# Patient Record
Sex: Male | Born: 1985 | Race: White | Hispanic: No | Marital: Married | Smoking: Never smoker
Health system: Southern US, Community
[De-identification: ages and names within clinical notes are randomized; demographics above are authoritative.]

---

## 2008-07-12 ENCOUNTER — Ambulatory Visit: Payer: Self-pay | Admitting: Family Medicine

## 2009-05-21 ENCOUNTER — Emergency Department: Payer: Self-pay | Admitting: Emergency Medicine

## 2017-05-28 ENCOUNTER — Encounter: Payer: Self-pay | Admitting: Physician Assistant

## 2017-05-28 ENCOUNTER — Ambulatory Visit: Payer: Self-pay | Admitting: Physician Assistant

## 2017-05-28 VITALS — BP 115/70 | HR 94 | Temp 98.5°F | Ht 68.0 in | Wt 183.0 lb

## 2017-05-28 DIAGNOSIS — Z008 Encounter for other general examination: Secondary | ICD-10-CM

## 2017-05-28 DIAGNOSIS — Z0000 Encounter for general adult medical examination without abnormal findings: Secondary | ICD-10-CM

## 2017-05-28 DIAGNOSIS — Z0189 Encounter for other specified special examinations: Secondary | ICD-10-CM

## 2017-05-28 NOTE — Progress Notes (Signed)
S: pt here for wellness physical and biometrics for insurance purposes, no complaints ros neg states his cholesterol is always high but its genetic. PMH:    Neg Social: occasional etoh, no drugs, nonsmoker  Fam: mother has RA, father has high cholesterol, sister has substance abuse problems, brother is healthy  O: vitals wnl, nad, ENT wnl, neck supple no lymph, lungs c t a, cv rrr, abd soft nontender bs normal all 4 quads  A: wellness, biometric physical  P:  Labs, f/u prn

## 2017-05-29 LAB — HEPATITIS C ANTIBODY (REFLEX): HCV Ab: 0.2 s/co ratio (ref 0.0–0.9)

## 2017-05-29 LAB — CMP12+LP+TP+TSH+6AC+CBC/D/PLT
A/G RATIO: 2 (ref 1.2–2.2)
ALK PHOS: 61 IU/L (ref 39–117)
ALT: 44 IU/L (ref 0–44)
AST: 25 IU/L (ref 0–40)
Albumin: 4.9 g/dL (ref 3.5–5.5)
BUN/Creatinine Ratio: 15 (ref 9–20)
BUN: 18 mg/dL (ref 6–20)
Basophils Absolute: 0 10*3/uL (ref 0.0–0.2)
Basos: 1 %
Bilirubin Total: 0.6 mg/dL (ref 0.0–1.2)
CALCIUM: 9.6 mg/dL (ref 8.7–10.2)
Chloride: 104 mmol/L (ref 96–106)
Chol/HDL Ratio: 5.3 ratio — ABNORMAL HIGH (ref 0.0–5.0)
Cholesterol, Total: 252 mg/dL — ABNORMAL HIGH (ref 100–199)
Creatinine, Ser: 1.19 mg/dL (ref 0.76–1.27)
EOS (ABSOLUTE): 0.1 10*3/uL (ref 0.0–0.4)
Eos: 2 %
Estimated CHD Risk: 1.1 times avg. — ABNORMAL HIGH (ref 0.0–1.0)
FREE THYROXINE INDEX: 1.7 (ref 1.2–4.9)
GFR calc Af Amer: 94 mL/min/{1.73_m2} (ref >59)
GFR calc non Af Amer: 81 mL/min/{1.73_m2} (ref >59)
GGT: 23 IU/L (ref 0–65)
GLOBULIN, TOTAL: 2.5 g/dL (ref 1.5–4.5)
Glucose: 99 mg/dL (ref 65–99)
HDL: 48 mg/dL (ref >39)
HEMATOCRIT: 46.7 % (ref 37.5–51.0)
Hemoglobin: 15.7 g/dL (ref 13.0–17.7)
IMMATURE GRANS (ABS): 0 10*3/uL (ref 0.0–0.1)
IMMATURE GRANULOCYTES: 0 %
Iron: 107 ug/dL (ref 38–169)
LDH: 180 IU/L (ref 121–224)
LDL CALC: 176 mg/dL — AB (ref 0–99)
LYMPHS: 39 %
Lymphocytes Absolute: 2.4 10*3/uL (ref 0.7–3.1)
MCH: 30.8 pg (ref 26.6–33.0)
MCHC: 33.6 g/dL (ref 31.5–35.7)
MCV: 92 fL (ref 79–97)
MONOCYTES: 9 %
MONOS ABS: 0.6 10*3/uL (ref 0.1–0.9)
NEUTROS PCT: 49 %
Neutrophils Absolute: 2.9 10*3/uL (ref 1.4–7.0)
Phosphorus: 3.5 mg/dL (ref 2.5–4.5)
Platelets: 253 10*3/uL (ref 150–379)
Potassium: 4.8 mmol/L (ref 3.5–5.2)
RBC: 5.09 x10E6/uL (ref 4.14–5.80)
RDW: 13.5 % (ref 12.3–15.4)
Sodium: 144 mmol/L (ref 134–144)
T3 Uptake Ratio: 29 % (ref 24–39)
T4 TOTAL: 5.7 ug/dL (ref 4.5–12.0)
TRIGLYCERIDES: 142 mg/dL (ref 0–149)
TSH: 2.74 u[IU]/mL (ref 0.450–4.500)
Total Protein: 7.4 g/dL (ref 6.0–8.5)
Uric Acid: 5 mg/dL (ref 3.7–8.6)
VLDL Cholesterol Cal: 28 mg/dL (ref 5–40)
WBC: 6 10*3/uL (ref 3.4–10.8)

## 2017-05-29 LAB — HIV ANTIBODY (ROUTINE TESTING W REFLEX): HIV SCREEN 4TH GENERATION: NONREACTIVE

## 2017-05-29 LAB — HCV COMMENT:

## 2017-05-29 LAB — VITAMIN D 25 HYDROXY (VIT D DEFICIENCY, FRACTURES): Vit D, 25-Hydroxy: 21.5 ng/mL — ABNORMAL LOW (ref 30.0–100.0)

## 2017-06-30 ENCOUNTER — Ambulatory Visit: Payer: Self-pay | Admitting: Family

## 2017-06-30 VITALS — BP 122/73 | HR 67 | Temp 98.3°F | Resp 16

## 2017-06-30 DIAGNOSIS — J029 Acute pharyngitis, unspecified: Secondary | ICD-10-CM

## 2017-06-30 MED ORDER — AMOXICILLIN 875 MG PO TABS: 875.0000 mg | ORAL_TABLET | Freq: Two times a day (BID) | ORAL | 0 refills | Status: DC

## 2017-06-30 NOTE — Progress Notes (Signed)
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 He has suffered with a sore throat 4 days. It hurts to swallow. His neck glands are mildly sore. He ran a temperature over the weekend of 101. He denies other ENT, respiratory or GI symptoms. He is concerned about strep.  Objective vital signs stable he is alert pleasant nontoxic ENT: TMs are retracted nasal mucosa swollen, pharynx hyperemic with white patch of exudate to right tonsil mild tenderness to cervical nodes heart RSR lungs are clear Strep swab negative  A/exudative  Pharyngitis Plan Rx amoxicillin and supportive measures.

## 2017-08-18 ENCOUNTER — Ambulatory Visit
Admission: RE | Admit: 2017-08-18 | Discharge: 2017-08-18 | Disposition: A | Discharge status: Home / Self Care | Payer: Managed Care, Other (non HMO) | Source: Ambulatory Visit | Attending: Physician Assistant | Admitting: Physician Assistant

## 2017-08-18 ENCOUNTER — Ambulatory Visit: Payer: Self-pay | Admitting: Physician Assistant

## 2017-08-18 ENCOUNTER — Encounter: Payer: Self-pay | Admitting: Physician Assistant

## 2017-08-18 VITALS — BP 110/80 | HR 64 | Temp 97.9°F

## 2017-08-18 DIAGNOSIS — M542 Cervicalgia: Secondary | ICD-10-CM | POA: Diagnosis present

## 2017-08-18 DIAGNOSIS — S0003XA Contusion of scalp, initial encounter: Secondary | ICD-10-CM
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
## 2017-08-19 NOTE — Progress Notes (Signed)
I called the patient per Francis Allen's request to check up on him and he expressed that his symptoms hasn't worsen.  He still is experiencing the same discomfort when he came to the clinic.  He will follow up as needed if symptoms worsens.

## 2017-08-25 ENCOUNTER — Ambulatory Visit: Payer: Self-pay | Admitting: Physician Assistant

## 2017-08-25 ENCOUNTER — Encounter: Payer: Self-pay | Admitting: Physician Assistant

## 2017-08-25 VITALS — BP 129/72 | HR 69 | Temp 98.5°F | Resp 16

## 2017-08-25 DIAGNOSIS — Z008 Encounter for other general examination: Secondary | ICD-10-CM

## 2017-08-25 NOTE — Progress Notes (Signed)
S: pt had scalp contusion over a week ago, would like to return to work, no headaches, dizziness, weakness, or visual changes, still has some neck stiffness but states his rom has gotten much better, no numbness or tingling  O:vitals wnl, nad, scalp bruising has healed, area at base of skull is just a little tender, perrl eomi, spine is normal, grips = b/l, nv/ intact  A: healing contusion, return to work assessment  P: pt may return to work
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
## 2018-06-16 ENCOUNTER — Ambulatory Visit: Payer: Self-pay | Admitting: Family Medicine

## 2018-06-16 VITALS — BP 131/80 | HR 79 | Resp 16 | Ht 68.0 in | Wt 188.0 lb

## 2018-06-16 DIAGNOSIS — Z0189 Encounter for other specified special examinations: Principal | ICD-10-CM

## 2018-06-16 DIAGNOSIS — Z008 Encounter for other general examination: Secondary | ICD-10-CM

## 2018-06-16 NOTE — Addendum Note (Signed)
Addended by: Frances Maywood'JERNES, KATHERINE M on: 06/16/2018 10:08 AM   Modules accepted: Orders

## 2018-06-16 NOTE — Progress Notes (Addendum)
Subjective: Annual biometrics screening  Patient presents for his annual biometric screening. Patient reports eating a healthy, well-rounded diet and getting regular physical activity.  Patient does not regularly see a primary care provider. PCP: None currently. Patient works for the [employer]. Patient denies any other issues or concerns.   Review of Systems Unremarkable  Objective  Physical Exam General: Awake, alert and oriented. No acute distress. Well developed, hydrated and nourished. Appears stated age.  HEENT: Supple neck without adenopathy. Sclera is non-icteric. The ear canal is clear without discharge. The tympanic membrane is normal in appearance with normal landmarks and cone of light. Nasal mucosa is pink and moist. Oral mucosa is pink and moist. The pharynx is normal in appearance without tonsillar swelling or exudates.  Skin: Skin in warm, dry and intact without rashes or lesions. Appropriate color for ethnicity. Cardiac: Heart rate and rhythm are normal. No murmurs, gallops, or rubs are auscultated.  Respiratory: The chest wall is symmetric and without deformity. No signs of respiratory distress. Lung sounds are clear in all lobes bilaterally without rales, ronchi, or wheezes.  Neurological: The patient is awake, alert and oriented to person, place, and time with normal speech.  Memory is normal and thought processes intact. No gait abnormalities are appreciated.  Psychiatric: Appropriate mood and affect.   Assessment Annual biometrics screening  Plan  Lipid panel and fasting blood sugar pending. Encouraged routine visits with primary care provider.  Advised patient to establish care with a primary care provider. Patient is requesting blood typing for work in case he were to need an urgent transfusion.  Order placed. Patient wants to return on another day to have this drawn. Encouraged patient to get regular exercise and eat a healthy, well-rounded diet.

## 2018-06-17 LAB — LIPID PANEL
CHOLESTEROL TOTAL: 256 mg/dL — AB (ref 100–199)
Chol/HDL Ratio: 5.3 ratio — ABNORMAL HIGH (ref 0.0–5.0)
HDL: 48 mg/dL (ref >39)
LDL Calculated: 183 mg/dL — ABNORMAL HIGH (ref 0–99)
TRIGLYCERIDES: 127 mg/dL (ref 0–149)
VLDL CHOLESTEROL CAL: 25 mg/dL (ref 5–40)

## 2018-06-17 LAB — ABO/RH

## 2018-06-17 LAB — GLUCOSE, RANDOM: Glucose: 94 mg/dL (ref 65–99)

## 2018-06-17 NOTE — Progress Notes (Signed)
Dear Mr. Langone, I wanted to let you know that your lipid panel and fasting blood sugar came back.  Everything is normal, with the exception of your total cholesterol, LDL cholesterol, and cholesterol/HDL ratio.  Your total cholesterol is elevated at 256, normal values are between 100 and 199.  Your LDL cholesterol ("bad cholesterol") is elevated at 183, normal values are below 99.  Your cholesterol/HDL ratio is elevated at 5.3, normal values are between 0 and 4.4 for women or 0 and 5 from men.  These abnormal values increase your risk for cardiovascular disease.  I would like you to follow-up with your primary care provider regarding these results.

## 2018-12-31 ENCOUNTER — Ambulatory Visit: Payer: Self-pay | Admitting: Emergency Medicine

## 2018-12-31 VITALS — BP 118/76 | HR 118 | Temp 99.5°F | Resp 14

## 2018-12-31 DIAGNOSIS — R059 Cough, unspecified: Secondary | ICD-10-CM

## 2018-12-31 DIAGNOSIS — J029 Acute pharyngitis, unspecified: Secondary | ICD-10-CM

## 2018-12-31 DIAGNOSIS — J101 Influenza due to other identified influenza virus with other respiratory manifestations: Secondary | ICD-10-CM

## 2018-12-31 DIAGNOSIS — R05 Cough: Secondary | ICD-10-CM

## 2018-12-31 LAB — POCT RAPID STREP A (OFFICE): Rapid Strep A Screen: NEGATIVE

## 2018-12-31 LAB — POCT INFLUENZA A/B
Influenza A, POC: NEGATIVE
Influenza B, POC: POSITIVE — AB

## 2018-12-31 MED ORDER — BENZONATATE 100 MG PO CAPS: 100.0000 mg | ORAL_CAPSULE | Freq: Three times a day (TID) | ORAL | 0 refills | Status: DC | PRN

## 2018-12-31 MED ORDER — OSELTAMIVIR PHOSPHATE 75 MG PO CAPS: 75.0000 mg | ORAL_CAPSULE | Freq: Two times a day (BID) | ORAL | 0 refills | Status: DC

## 2018-12-31 NOTE — Patient Instructions (Signed)
Take Tamiflu as instructed. If you have worsening symptoms with chest pain or shortness of breath please present to the emergency room for chest x-ray and further evaluation You will be contagious for 5 days from onset of illness. Continue DayQuil during the day and NyQuil at night.   Influenza, Adult Influenza is also called "the flu." It is an infection in the lungs, nose, and throat (respiratory tract). It is caused by a virus. The flu causes symptoms that are similar to symptoms of a cold. It also causes a high fever and body aches. The flu spreads easily from person to person (is contagious). Getting a flu shot (influenza vaccination) every year is the best way to prevent the flu. What are the causes? This condition is caused by the influenza virus. You can get the virus by:  Breathing in droplets that are in the air from the cough or sneeze of a person who has the virus.  Touching something that has the virus on it (is contaminated) and then touching your mouth, nose, or eyes. What increases the risk? Certain things may make you more likely to get the flu. These include:  Not washing your hands often.  Having close contact with many people during cold and flu season.  Touching your mouth, eyes, or nose without first washing your hands.  Not getting a flu shot every year. You may have a higher risk for the flu, along with serious problems such as a lung infection (pneumonia), if you:  Are older than 65.  Are pregnant.  Have a weakened disease-fighting system (immune system) because of a disease or taking certain medicines.  Have a long-term (chronic) illness, such as: ? Heart, kidney, or lung disease. ? Diabetes. ? Asthma.  Have a liver disorder.  Are very overweight (morbidly obese).  Have anemia. This is a condition that affects your red blood cells. What are the signs or symptoms? Symptoms usually begin suddenly and last 4-14 days. They may include:  Fever and  chills.  Headaches, body aches, or muscle aches.  Sore throat.  Cough.  Runny or stuffy (congested) nose.  Chest discomfort.  Not wanting to eat as much as normal (poor appetite).  Weakness or feeling tired (fatigue).  Dizziness.  Feeling sick to your stomach (nauseous) or throwing up (vomiting). How is this treated? If the flu is found early, you can be treated with medicine that can help reduce how bad the illness is and how long it lasts (antiviral medicine). This may be given by mouth (orally) or through an IV tube. Taking care of yourself at home can help your symptoms get better. Your doctor may suggest:  Taking over-the-counter medicines.  Drinking plenty of fluids. The flu often goes away on its own. If you have very bad symptoms or other problems, you may be treated in a hospital. Follow these instructions at home:     Activity  Rest as needed. Get plenty of sleep.  Stay home from work or school as told by your doctor. ? Do not leave home until you do not have a fever for 24 hours without taking medicine. ? Leave home only to visit your doctor. Eating and drinking  Take an ORS (oral rehydration solution). This is a drink that is sold at pharmacies and stores.  Drink enough fluid to keep your pee (urine) pale yellow.  Drink clear fluids in small amounts as you are able. Clear fluids include: ? Water. ? Ice chips. ? Fruit juice that  has water added (diluted fruit juice). ? Low-calorie sports drinks.  Eat bland, easy-to-digest foods in small amounts as you are able. These foods include: ? Bananas. ? Applesauce. ? Rice. ? Lean meats. ? Toast. ? Crackers.  Do not eat or drink: ? Fluids that have a lot of sugar or caffeine. ? Alcohol. ? Spicy or fatty foods. General instructions  Take over-the-counter and prescription medicines only as told by your doctor.  Use a cool mist humidifier to add moisture to the air in your home. This can make it easier  for you to breathe.  Cover your mouth and nose when you cough or sneeze.  Wash your hands with soap and water often, especially after you cough or sneeze. If you cannot use soap and water, use alcohol-based hand sanitizer.  Keep all follow-up visits as told by your doctor. This is important. How is this prevented?   Get a flu shot every year. You may get the flu shot in late summer, fall, or winter. Ask your doctor when you should get your flu shot.  Avoid contact with people who are sick during fall and winter (cold and flu season). Contact a doctor if:  You get new symptoms.  You have: ? Chest pain. ? Watery poop (diarrhea). ? A fever.  Your cough gets worse.  You start to have more mucus.  You feel sick to your stomach.  You throw up. Get help right away if you:  Have shortness of breath.  Have trouble breathing.  Have skin or nails that turn a bluish color.  Have very bad pain or stiffness in your neck.  Get a sudden headache.  Get sudden pain in your face or ear.  Cannot eat or drink without throwing up. Summary  Influenza ("the flu") is an infection in the lungs, nose, and throat. It is caused by a virus.  Take over-the-counter and prescription medicines only as told by your doctor.  Getting a flu shot every year is the best way to avoid getting the flu. This information is not intended to replace advice given to you by your health care provider. Make sure you discuss any questions you have with your health care provider. Document Released: 09/24/2008 Document Revised: 06/03/2018 Document Reviewed: 06/03/2018 Elsevier Interactive Patient Education  2019 [employer].

## 2018-12-31 NOTE — Progress Notes (Signed)
Subjective: Patient presents with onset on Monday of a runny nose and cold symptoms.  Yesterday he developed full-blown symptoms of headache fever, chills, myalgias, and a cough which at times has been productive of a greenish phlegm.  At times his phlegm is clear.  Of note his 2 children were diagnosed with flu last week but no testing was done.  He has been taking DayQuil for his symptoms. Review of systems: Noncontributory except as relates to this illness. Of note he does not smoke or vape. Objective: Vitals:   12/31/18 1006  BP: 118/76  Pulse: (!) 118  Resp: 14  Temp: 99.5 F (37.5 C)  SpO2: 96%   Patient wearing mask and actively coughing. TMs are slightly red. Nose is congested. Throat is very red posterior pharynx with drainage. Chest is clear to auscultation.  There were occasional rhonchi on the right. Heart regular rate and rhythm. Flu testing positive influenza B. Strep test done.Negative Assessment: I am not sure of the true onset of symptoms.  He states that yesterday was when he developed the myalgias fever and chills associated with his cough.  Because of this we will go ahead and try Tamiflu for 5 days.  He will be given Tessalon Perles for cough.  He was given a note to be out of work for 2 days.  He can continue his DayQuil. Plan: Tamiflu 75 twice daily. Continue DayQuil during the day and NyQuil at night. Tessalon Perles for cough. Patient given note to be out of work until Monday.

## 2019-01-01 LAB — STREP A DNA PROBE: Strep Gp A Direct, DNA Probe: NEGATIVE

## 2019-06-24 ENCOUNTER — Other Ambulatory Visit: Payer: Self-pay

## 2019-06-24 ENCOUNTER — Ambulatory Visit: Payer: Managed Care, Other (non HMO) | Admitting: Adult Health

## 2019-06-24 ENCOUNTER — Encounter: Payer: Self-pay | Admitting: Adult Health

## 2019-06-24 VITALS — BP 116/72 | HR 64 | Temp 97.6°F | Resp 15 | Ht 68.0 in | Wt 180.0 lb

## 2019-06-24 DIAGNOSIS — Z202 Contact with and (suspected) exposure to infections with a predominantly sexual mode of transmission: Secondary | ICD-10-CM

## 2019-06-24 LAB — POCT URINALYSIS DIPSTICK
Bilirubin, UA: NEGATIVE
Blood, UA: NEGATIVE
Glucose, UA: NEGATIVE
Ketones, UA: NEGATIVE
Nitrite, UA: NEGATIVE
Protein, UA: NEGATIVE
Spec Grav, UA: 1.025 (ref 1.010–1.025)
Urobilinogen, UA: 1 E.U./dL
pH, UA: 7 (ref 5.0–8.0)

## 2019-06-24 MED ORDER — METRONIDAZOLE 500 MG PO TABS: 500.0000 mg | ORAL_TABLET | Freq: Two times a day (BID) | ORAL | 0 refills | Status: DC

## 2019-06-24 NOTE — Patient Instructions (Addendum)
Recommend following up for a repeat urine in 2 months after treatment sooner if symptoms.  Also recommend further Sexually transmitted disease testing if warranted with your primary care provider.  Do not drink any alcohol with in 2 weeks of taking prescribed medication.   Trichomoniasis Trichomoniasis is an STI (sexually transmitted infection) that can affect both women and men. In women, the outer area of the male genitalia (vulva) and the vagina are affected. In men, the penis is mainly affected, but the prostate and other reproductive organs can also be involved. This condition can be treated with medicine. It often has no symptoms (is asymptomatic), especially in men. What are the causes? This condition is caused by an organism called Trichomonas vaginalis. Trichomoniasis most often spreads from person to person (is contagious) through sexual contact. What increases the risk? The following factors may make you more likely to develop this condition:  Having unprotected sexual intercourse.  Having sexual intercourse with a partner who has trichomoniasis.  Having multiple sexual partners.  Having had previous trichomoniasis infections or other STIs. What are the signs or symptoms? In women, symptoms of trichomoniasis include:  Abnormal vaginal discharge that is clear, white, gray, or yellow-green and foamy and has an unusual "fishy" odor.  Itching and irritation of the vagina and vulva.  Burning or pain during urination or sexual intercourse.  Genital redness and swelling. In men, symptoms of trichomoniasis include:  Penile discharge that may be foamy or contain pus.  Pain in the penis. This may happen only when urinating.  Itching or irritation inside the penis.  Burning after urination or ejaculation. How is this diagnosed? In women, this condition may be found during a routine Pap test or physical exam. It may be found in men during a routine physical exam. Your health  care provider may perform tests to help diagnose this infection, such as:  Urine tests (men and women).  The following in women: ? Testing the pH of the vagina. ? A vaginal swab test that checks for the Trichomonas vaginalis organism. ? Testing vaginal secretions. Your health care provider may test you for other STIs, including HIV (human immunodeficiency virus). How is this treated? This condition is treated with medicine taken by mouth (orally), such as metronidazole or tinidazole to fight the infection. Your sexual partner(s) may also need to be tested and treated.  If you are a woman and you plan to become pregnant or think you may be pregnant, tell your health care provider right away. Some medicines that are used to treat the infection should not be taken during pregnancy. Your health care provider may recommend over-the-counter medicines or creams to help relieve itching or irritation. You may be tested for infection again 3 months after treatment. Follow these instructions at home:  Take and use over-the-counter and prescription medicines, including creams, only as told by your health care provider.  Do not have sexual intercourse until one week after you finish your medicine, or until your health care provider approves. Ask your health care provider when you may resume sexual intercourse.  (Women) Do not douche or wear tampons while you have the infection.  Discuss your infection with your sexual partner(s). Make sure that your partner gets tested and treated, if necessary.  Keep all follow-up visits as told by your health care provider. This is important. How is this prevented?  Use condoms every time you have sex. Using condoms correctly and consistently can help protect against STIs.  Avoid having multiple  sexual partners.  Talk with your sexual partner about any symptoms that either of you may have, as well as any history of STIs.  Get tested for STIs and STDs (sexually  transmitted diseases) before you have sex. Ask your partner to do the same.  Do not have sexual contact if you have symptoms of trichomoniasis or another STI. Contact a health care provider if:  You still have symptoms after you finish your medicine.  You develop pain in your abdomen.  You have pain when you urinate.  You have bleeding after sexual intercourse.  You develop a rash.  You feel nauseous or you vomit.  You plan to become pregnant or think you may be pregnant. Summary  Trichomoniasis is an STI (sexually transmitted infection) that can affect both women and men.  This condition often has no symptoms (is asymptomatic), especially in men.  You should not have sexual intercourse until one week after you finish your medicine, or until your health care provider approves. Ask your health care provider when you may resume sexual intercourse.  Discuss your infection with your sexual partner. Make sure that your partner gets tested and treated, if necessary. This information is not intended to replace advice given to you by your health care provider. Make sure you discuss any questions you have with your health care provider. Document Released: 06/11/2001 Document Revised: 11/08/2016 Document Reviewed: 11/08/2016 Elsevier Interactive Patient Education  2019 [employer].  Metronidazole tablets or capsules What is this medicine? METRONIDAZOLE (me troe NI da zole) is an antiinfective. It is used to treat certain kinds of bacterial and protozoal infections. It will not work for colds, flu, or other viral infections. This medicine may be used for other purposes; ask your health care provider or pharmacist if you have questions. COMMON BRAND NAME(S): Flagyl What should I tell my health care provider before I take this medicine? They need to know if you have any of these conditions: -Cockayne syndrome -history of blood diseases, like sickle cell anemia or leukemia -history of  yeast infection -if you often drink alcohol -liver disease -an unusual or allergic reaction to metronidazole, nitroimidazoles, or other medicines, foods, dyes, or preservatives -pregnant or trying to get pregnant -breast-feeding How should I use this medicine? Take this medicine by mouth with a full glass of water. Follow the directions on the prescription label. Take your medicine at regular intervals. Do not take your medicine more often than directed. Take all of your medicine as directed even if you think you are better. Do not skip doses or stop your medicine early. Talk to your pediatrician regarding the use of this medicine in children. Special care may be needed. Overdosage: If you think you have taken too much of this medicine contact a poison control center or emergency room at once. NOTE: This medicine is only for you. Do not share this medicine with others. What if I miss a dose? If you miss a dose, take it as soon as you can. If it is almost time for your next dose, take only that dose. Do not take double or extra doses. What may interact with this medicine? Do not take this medicine with any of the following medications: -alcohol or any product that contains alcohol -cisapride -disulfiram -dofetilide -dronedarone -pimozide -thioridazine -ziprasidone This medicine may also interact with the following medications: -amiodarone -birth control pills -busulfan -carbamazepine -cimetidine -cyclosporine -fluorouracil -lithium -other medicines that prolong the QT interval (cause an abnormal heart rhythm) -phenobarbital -phenytoin -quinidine -  tacrolimus -vecuronium -warfarin This list may not describe all possible interactions. Give your health care provider a list of all the medicines, herbs, non-prescription drugs, or dietary supplements you use. Also tell them if you smoke, drink alcohol, or use illegal drugs. Some items may interact with your medicine. What should I  watch for while using this medicine? Tell your doctor or health care professional if your symptoms do not improve or if they get worse. You may get drowsy or dizzy. Do not drive, use machinery, or do anything that needs mental alertness until you know how this medicine affects you. Do not stand or sit up quickly, especially if you are an older patient. This reduces the risk of dizzy or fainting spells. Ask your doctor or health care professional if you should avoid alcohol. Many nonprescription cough and cold products contain alcohol. Metronidazole can cause an unpleasant reaction when taken with alcohol. The reaction includes flushing, headache, nausea, vomiting, sweating, and increased thirst. The reaction can last from 30 minutes to several hours. If you are being treated for a sexually transmitted disease, avoid sexual contact until you have finished your treatment. Your sexual partner may also need treatment. What side effects may I notice from receiving this medicine? Side effects that you should report to your doctor or health care professional as soon as possible: -allergic reactions like skin rash or hives, swelling of the face, lips, or tongue -confusion -fast, irregular heartbeat -fever, chills, sore throat -fever with rash, swollen lymph nodes, or swelling of the face -pain, tingling, numbness in the hands or feet -redness, blistering, peeling or loosening of the skin, including inside the mouth -seizures -sign and symptoms of liver injury like dark yellow or brown urine; general ill feeling or flu-like symptoms; light colored stools; loss of appetite; nausea; right upper belly pain; unusually weak or tired; yellowing of the eyes or skin -vaginal discharge, itching, or odor in women Side effects that usually do not require medical attention (report to your doctor or health care professional if they continue or are bothersome): -changes in taste -diarrhea -headache -nausea,  vomiting -stomach pain This list may not describe all possible side effects. Call your doctor for medical advice about side effects. You may report side effects to FDA at 1-800-FDA-1088. Where should I keep my medicine? Keep out of the reach of children. Store at room temperature below 25 degrees C (77 degrees F). Protect from light. Keep container tightly closed. Throw away any unused medicine after the expiration date. NOTE: This sheet is a summary. It may not cover all possible information. If you have questions about this medicine, talk to your doctor, pharmacist, or health care provider.  2019 Elsevier/Gold Standard (2017-03-19 20:55:23)   Preventing Sexually Transmitted Infections, Adult Sexually transmitted infections (STIs) are diseases that are passed (transmitted) from person to person through bodily fluids exchanged during sex or sexual contact. Bodily fluids include saliva, semen, blood, vaginal mucus, and urine. You may have an increased risk for developing an STI if you have unprotected oral, vaginal, or anal sex. Some common STIs include:  Herpes.  Hepatitis B.  Chlamydia.  Gonorrhea.  Syphilis.  HPV (human papillomavirus).  HIV (human immunodeficiency virus), the virus that can cause AIDS (acquired immunodeficiency syndrome). How can I protect myself from sexually transmitted infections? The only way to completely prevent STIs is not to have sex of any kind (practice abstinence). This includes oral, vaginal, or anal sex. If you are sexually active, take these actions to lower  your risk of getting an STI:  Have only one sex partner (be monogamous) or limit the number of sexual partners you have.  Stay up-to-date on immunizations. Certain vaccines can lower your risk of getting certain STIs, such as: ? Hepatitis A and B vaccines. You may have been vaccinated as a young child, but likely need a booster shot as a teen or young adult. ? HPV vaccine.  Use methods that  prevent the exchange of body fluids between partners (barrier protection) every time you have sex. Barrier protection can be used during oral, vaginal, or anal sex. Commonly used barrier methods include: ? Male condom. ? Male condom. ? Dental dam.  Get tested regularly for STIs. Have your sexual partner get tested regularly as well.  Avoid mixing alcohol, drugs, and sex. Alcohol and drug use can affect your ability to make good decisions and can lead to risky sexual behaviors.  Ask your health care provider about taking pre-exposure prophylaxis (PrEP) to prevent HIV infection if you: ? Have a HIV-positive sexual partner. ? Have multiple sexual partners or partners who do not know their HIV status, and do not regularly use a condom during sex. ? Use injection drugs and share needles. Birth control pills, injections, implants, and intrauterine devices (IUDs) do not protect against STIs. To prevent both STIs and pregnancy, always use a condom with another form of birth control. Some STIs, such as herpes, are spread through skin to skin contact. A condom does not protect you from getting such STIs. If you or your partner have herpes and there is an active flare with open sores, avoid all sexual contact. Why are these changes important? Taking steps to practice safe sex protects you and others. Many STIs can be cured. However, some STIs are not curable and will affect you for the rest of your life. STIs can be passed on to another person even if you do not have symptoms. What can happen if changes are not made? Certain STIs may:  Require you to take medicine for the rest of your life.  Affect your ability to have children (your fertility).  Increase your risk for developing another STI or certain serious health conditions, such as: ? Cervical cancer. ? Head and neck cancer. ? Pelvic inflammatory disease (PID) in women. ? Organ damage or damage to other parts of your body, if the infection  spreads.  Be passed to a baby during childbirth. How are sexually transmitted infections treated? If you or your partner know or think that you may have an STI:  Talk with your health care provider about what can be done to treat it. Some STIs can be treated and cured with medicines.  For curable STIs, you and your partner should avoid sex during treatment and for several days after treatment is complete.  You and your partner should both be treated at the same time, if there is any chance that your partner is infected as well. If you get treatment but your partner does not, your partner can re-infect you when you resume sexual contact.  Do not have unprotected sex. Where to find more information Learn more about sexually transmitted diseases and infections from:  Centers for Disease Control and Prevention: ? More information about specific STIs: SolutionApps.co.zawww.cdc.gov/std ? Find places to get sexual health counseling and treatment for free or for a low cost: gettested.TonerPromos.nocdc.gov  U.S. Department of Health and Human Services: NotebookPreviews.siwww.womenshealth.gov/publications/our-publications/fact-sheet/sexually-transmitted-infections.html Summary  The only way to completely prevent STIs is not to  have sex (practice abstinence), including oral, vaginal, or anal sex.  STIs can spread through saliva, semen, blood, vaginal mucus, urine, or sexual contact.  If you do have sex, limit your number of sexual partners and use a barrier protection method every time you have sex.  If you develop an STI, get treated right away and ask your partner to be treated as well. Do not resume having sex until both of you have completed treatment for the STI. This information is not intended to replace advice given to you by your health care provider. Make sure you discuss any questions you have with your health care provider. Document Released: 12/12/2016 Document Revised: 05/22/2018 Document Reviewed: 12/12/2016 Elsevier Interactive  Patient Education  2019 Else[employer] Inc. Metronidazole tablets or capsules What is this medicine? METRONIDAZOLE (me troe NI da zole) is an antiinfective. It is used to treat certain kinds of bacterial and protozoal infections. It will not work for colds, flu, or other viral infections. This medicine may be used for other purposes; ask your health care provider or pharmacist if you have questions. COMMON BRAND NAME(S): Flagyl What should I tell my health care provider before I take this medicine? They need to know if you have any of these conditions: -Cockayne syndrome -history of blood diseases, like sickle cell anemia or leukemia -history of yeast infection -if you often drink alcohol -liver disease -an unusual or allergic reaction to metronidazole, nitroimidazoles, or other medicines, foods, dyes, or preservatives -pregnant or trying to get pregnant -breast-feeding How should I use this medicine? Take this medicine by mouth with a full glass of water. Follow the directions on the prescription label. Take your medicine at regular intervals. Do not take your medicine more often than directed. Take all of your medicine as directed even if you think you are better. Do not skip doses or stop your medicine early. Talk to your pediatrician regarding the use of this medicine in children. Special care may be needed. Overdosage: If you think you have taken too much of this medicine contact a poison control center or emergency room at once. NOTE: This medicine is only for you. Do not share this medicine with others. What if I miss a dose? If you miss a dose, take it as soon as you can. If it is almost time for your next dose, take only that dose. Do not take double or extra doses. What may interact with this medicine? Do not take this medicine with any of the following medications: -alcohol or any product that contains  alcohol -cisapride -disulfiram -dofetilide -dronedarone -pimozide -thioridazine -ziprasidone This medicine may also interact with the following medications: -amiodarone -birth control pills -busulfan -carbamazepine -cimetidine -cyclosporine -fluorouracil -lithium -other medicines that prolong the QT interval (cause an abnormal heart rhythm) -phenobarbital -phenytoin -quinidine -tacrolimus -vecuronium -warfarin This list may not describe all possible interactions. Give your health care provider a list of all the medicines, herbs, non-prescription drugs, or dietary supplements you use. Also tell them if you smoke, drink alcohol, or use illegal drugs. Some items may interact with your medicine. What should I watch for while using this medicine? Tell your doctor or health care professional if your symptoms do not improve or if they get worse. You may get drowsy or dizzy. Do not drive, use machinery, or do anything that needs mental alertness until you know how this medicine affects you. Do not stand or sit up quickly, especially if you are an older patient. This reduces the risk of  dizzy or fainting spells. Ask your doctor or health care professional if you should avoid alcohol. Many nonprescription cough and cold products contain alcohol. Metronidazole can cause an unpleasant reaction when taken with alcohol. The reaction includes flushing, headache, nausea, vomiting, sweating, and increased thirst. The reaction can last from 30 minutes to several hours. If you are being treated for a sexually transmitted disease, avoid sexual contact until you have finished your treatment. Your sexual partner may also need treatment. What side effects may I notice from receiving this medicine? Side effects that you should report to your doctor or health care professional as soon as possible: -allergic reactions like skin rash or hives, swelling of the face, lips, or tongue -confusion -fast, irregular  heartbeat -fever, chills, sore throat -fever with rash, swollen lymph nodes, or swelling of the face -pain, tingling, numbness in the hands or feet -redness, blistering, peeling or loosening of the skin, including inside the mouth -seizures -sign and symptoms of liver injury like dark yellow or brown urine; general ill feeling or flu-like symptoms; light colored stools; loss of appetite; nausea; right upper belly pain; unusually weak or tired; yellowing of the eyes or skin -vaginal discharge, itching, or odor in women Side effects that usually do not require medical attention (report to your doctor or health care professional if they continue or are bothersome): -changes in taste -diarrhea -headache -nausea, vomiting -stomach pain This list may not describe all possible side effects. Call your doctor for medical advice about side effects. You may report side effects to FDA at 1-800-FDA-1088. Where should I keep my medicine? Keep out of the reach of children. Store at room temperature below 25 degrees C (77 degrees F). Protect from light. Keep container tightly closed. Throw away any unused medicine after the expiration date. NOTE: This sheet is a summary. It may not cover all possible information. If you have questions about this medicine, talk to your doctor, pharmacist, or health care provider.  2019 Elsevier/Gold Standard (2017-03-19 20:55:23)

## 2019-06-24 NOTE — Progress Notes (Addendum)
  [facility]   Patient ID: Francis Allen DOB: 33 y.o. MRN: [id number]  Subjective: Patient is a 33 year old male in no acute distress, who comes to the clinic for a known exposure with his wife to trichomonas.  Patient reports his wife had trichomonas last year and her gynecologist feels as if it never went away and is possibly resistant she tested positive last week. He was not tested or treated last year.   Patient denies any symptoms himself.  He was advised to come to the clinic per his wife's gynecologist. He denies any penile symptoms or urinary symptoms. He desires treatment for trichomonas.  He denies any high risk sexual behaviors or new partners.    Patient  denies any fever, body aches,chills, rash, chest pain, shortness of breath, nausea, vomiting, or diarrhea.    Objective: Blood pressure 116/72, pulse 64, temperature 97.6 F (36.4 C), temperature source Oral, resp. rate 15, height 5\' 8"  (1.727 m), weight 180 lb (81.6 kg), SpO2 99 %.  Physical Exam  Vitals reviewed. Constitutional: He appears well-developed and well-nourished.  HENT:  Head: Normocephalic and atraumatic.  Neck: Normal range of motion. Neck supple.  Cardiovascular: Normal rate and regular rhythm. Exam reveals no gallop and no friction rub.  No murmur heard. Respiratory: Effort normal and breath sounds normal. No respiratory distress. He has no wheezes. He has no rales. He exhibits no tenderness.  Genitourinary:    Genitourinary Comments: Declines denies any issues    Skin: Skin is warm and dry. No rash noted. No erythema. No pallor.   Patient declines genital exam, denies lesions or any sores or any penile problems or testicular issues.   Assessment:  Orders Placed This Encounter  Procedures  . Urine Culture  . Chlamydia/Gonococcus/Trichomonas, NAA  . POCT Urinalysis Dipstick (CPT 81002)      Plan:  Treatment options discussed, as well as  risks, benefits, alternatives. Patient voiced understanding and agreement with the following plans:  New Prescriptions   METRONIDAZOLE (FLAGYL) 500 MG TABLET    Take 1 tablet (500 mg total) by mouth 2 (two) times daily.     Patient is advised to follow-up with the clinic for a retest after treatment, timeframe and guidelines given.  He will come back sooner if he starts to develop symptoms or has any other ongoing issues.  Patient is advised that he should have further STD testing as well with his primary care provider, or seek health department per CDC guidelines. Orders Placed This Encounter  Procedures  . Urine Culture  . Chlamydia/Gonococcus/Trichomonas, NAA  . POCT Urinalysis Dipstick (CPT 81002)   Discussed follow up and retesting per CDC guidelines.  Follow-up with your primary care doctor in 5-7 days if not improving, or sooner if symptoms become worse. Precautions discussed. Red flags discussed. Questions invited and answered. Patient voiced understanding and agreement.

## 2019-06-28 ENCOUNTER — Encounter: Payer: Self-pay | Admitting: Adult Health

## 2019-06-28 LAB — URINE CULTURE

## 2019-06-28 LAB — CHLAMYDIA/GONOCOCCUS/TRICHOMONAS, NAA
Chlamydia by NAA: NEGATIVE
Gonococcus by NAA: NEGATIVE
Trich vag by NAA: POSITIVE — AB

## 2019-07-06 ENCOUNTER — Telehealth: Payer: Managed Care, Other (non HMO) | Admitting: Adult Health

## 2019-07-06 ENCOUNTER — Other Ambulatory Visit: Payer: Self-pay

## 2019-07-06 ENCOUNTER — Encounter: Payer: Self-pay | Admitting: Adult Health

## 2019-07-06 DIAGNOSIS — J020 Streptococcal pharyngitis: Secondary | ICD-10-CM

## 2019-07-06 MED ORDER — AMOXICILLIN-POT CLAVULANATE 875-125 MG PO TABS: 1.0000 | ORAL_TABLET | Freq: Two times a day (BID) | ORAL | 0 refills | Status: DC

## 2019-07-06 NOTE — Progress Notes (Addendum)
[name]   Virtual Visit via Telephone Note  I connected with Francis Allen on 07/06/19 at  2:00 PM EDT by telephone and verified that I am speaking with the correct person using two identifiers.  Location: Patient:  At work  Provider: [facility], [place], Francis [place]      I discussed the limitations, risks, security and privacy concerns of performing an evaluation and management service by telephone and the availability of in person appointments. I also discussed with the patient that there may be a patient responsible charge related to this service. The patient expressed understanding and agreed to proceed.   History of Present Illness:  Patient is a 33 year old male in no acute distress who comes to the clinic for a virtual visit for a sore throat that has been present for 3 days that he now has white spots on the back of his left tonsil. He denies any other symptoms, he has no cold symptoms or respiratory symptoms.  He denies body aches or chills.  He has no gastrointestinal symptoms He denies  any exposure to COVID-19. He has recently completed Flagyl for trichomonas and will be return to the office for repeat urine test in the near future. He denies any difficulty swallowing or breathing.  He is able to swallow liquids without any problems.  He denies any chest pain. Denies any recent antibiotics.  He denies any sick family members or recent hospitalizations. Does report a history of strep throat in the past.  Patient  denies any fever, body aches,chills, rash, chest pain, shortness of breath, nausea, vomiting, or diarrhea.     Observations/Objective:  Patient is alert and oriented and responsive to questions Engages in conversation with provider. Speaks in full sentences without any pauses without any shortness of breath or distress.  Patient has normal skin color Francis Allen, he is pleasant  and appears well smiles and engages in conversation with provider  Assessment and Plan:  1. Strep pharyngitis  Other orders - amoxicillin-clavulanate (AUGMENTIN) 875-125 MG tablet; Take 1 tablet by mouth 2 (two) times daily.  Follow Up Instructions: He is advised to wear a mask while he is at work, he should remain out of work 24 hours until he has been on antibiotics, if symptoms have not improved or he develops a fever or any other symptoms he will call back to the clinic and will stay home from work.  He is not to return to work if he has a fever and he will monitor his temperature.  He was offered COVID-19 testing but declines as he reports this is his normal feeling he has with strep throat and he does have signs and symptoms very consistent with strep pharyngitis.   He verbalizes understanding of all instructions. I discussed the assessment and treatment plan with the patient. The patient was provided an opportunity to ask questions and all were answered. The patient agreed with the plan and demonstrated an understanding of the instructions.   The patient was advised to call back or seek an in-person evaluation if the symptoms worsen or if the condition fails to improve as anticipated.  I provided 20 minutes of non-face-to-face time during this encounter.   Marcille Buffy, FNP

## 2019-07-12 ENCOUNTER — Ambulatory Visit: Payer: Managed Care, Other (non HMO) | Admitting: Adult Health

## 2019-07-20 ENCOUNTER — Ambulatory Visit: Payer: Managed Care, Other (non HMO) | Admitting: Adult Health

## 2019-07-20 ENCOUNTER — Other Ambulatory Visit: Payer: Self-pay

## 2019-07-20 ENCOUNTER — Encounter: Payer: Self-pay | Admitting: Adult Health

## 2019-07-20 ENCOUNTER — Other Ambulatory Visit: Payer: Managed Care, Other (non HMO) | Admitting: Adult Health

## 2019-07-20 VITALS — BP 118/78 | HR 98 | Resp 15 | Ht 68.0 in | Wt 182.0 lb

## 2019-07-20 DIAGNOSIS — Z8619 Personal history of other infectious and parasitic diseases: Secondary | ICD-10-CM

## 2019-07-20 DIAGNOSIS — Z008 Encounter for other general examination: Secondary | ICD-10-CM | POA: Diagnosis not present

## 2019-07-20 NOTE — Addendum Note (Signed)
Addended by: Judie Petit on: 07/20/2019 09:33 AM   Modules accepted: Orders

## 2019-07-20 NOTE — Progress Notes (Addendum)
Francis Allen DOB: 33 y.o. MRN: [id number]  Subjective:  Here for Biometric Screen/brief exam Patient is here for biometric exam.  He is physically fit he reports he ran 2 miles this morning before this appointment. He tries to eat a well balanced diet.   Denies any cardiac history. Denies any syncope. Denies any palpitations.    Denies any angina, breathlessness, palpitations, edema, syncope, dizziness.   He denies any shortness of breath at rest or with activity. 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  He does not have a primary care provider however he reports he knows he needs to get one.   Strep Pharyngitis resolved within 48 hours of antibiotics he reports. He denies any other illness or exposure to illness.  He is a non smoker.  He denies any health problems.  His wife did retest for trichomonas after treatment and she was negative, he is being retested today for test of cure.  Patient  denies any fever, body aches,chills, rash, chest pain, shortness of breath, nausea, vomiting, or diarrhea.    Denies any Muscular and/or skeletal problems and able to lift weights without difficulty.    There are no active problems to display for this patient.  No current outpatient medications on file.   Objective Blood pressure 118/78, pulse 98, resp. rate 15, height 5\' 8"  (1.727 m), weight 182 lb (82.6 kg), SpO2 99 %.  NAD Patient is alert and oriented and responsive to questions Engages in eye contact with provider. Speaks in full sentences without any pauses without any shortness of breath or distress.  HEENT: Within normal limits Neck: Normal, supple, no lymphadenopathy Heart: Regular rate and rhythm, No murmurs, rubs or gallops. No JVD, No carotid bruit bilaterally.  Lungs: Clear to auscultation without any adventitious lung sounds.  Musculoskeletal: range of motion is normal, no deficits.  Patient moves on and  off of exam table and in room without difficulty. Gait is normal in hall and in room.    Assessment: 1. Encounter for other general examination   2. Hx of trichomoniasis- treated/ re-test for cure        Plan: Retest for trichomonas urine - for cure test.  Patient requests to know what type of blood he has/ lab ordered.  Fasting glucose and lipids. Discussed with patient that today's visit here is a limited biometric screening visit (not a comprehensive exam or management of any chronic problems) Discussed some health issues, including healthy eating habits and exercise. Encouraged to follow-up with PCP for annual comprehensive preventive and wellness care (and if applicable, any chronic issues). Questions invited and answered. Advised needs primary care provider as soon as possible. Resources to help find a primary care are given.

## 2019-07-20 NOTE — Addendum Note (Signed)
Addended by: Judie Petit on: 07/20/2019 09:32 AM   Modules accepted: Orders

## 2019-07-20 NOTE — Patient Instructions (Signed)
The Biometric exam is a brief physical with labs including glucose and cholesterol. This does not replace a full physical with a primary care provider, and additional recommended labs. This is an acute care clinic not for maintenance of chronic or long standing conditions.   Provider also recommends if you do not have a primary care provider for patient to establish care promptly.You can choose any provider of your choice at any facility of your choice, the below information is  just a resource to aid in you finding a primary care provider for routine health maintenance.   [facility]  PHYSICIAN/PROVIDER  REFERRAL LINE at 1-800-449- 8688  [email or web address] to help assist with finding a primary care doctor.   Helpful resources below of other primary care office's accepting new patients.   . [facility]         [address]  [place] [address] [phone]  . [facility]    [address], [address] [place], [place] [address] [phone]  . [facility] [address]  [place], [place]  [phone]   . [facility]  [address], [address] [place] [address] [phone]    Follow up with primary care as needed for chronic and maintenance health care- can be seen in this employee clinic for acute care.     Health Maintenance, Male Adopting a healthy lifestyle and getting preventive care are important in promoting health and wellness. Ask your health care provider about:  The right schedule for you to have regular tests and exams.  Things you can do on your own to prevent diseases and keep yourself healthy. What should I know about diet, weight, and exercise? Eat a healthy diet   Eat a diet that includes plenty of vegetables, fruits, low-fat dairy products, and lean protein.  Do not eat a lot of foods that are high in solid fats, added sugars, or sodium.  Maintain a healthy weight Body mass index (BMI) is a measurement that can be used to identify possible weight problems. It estimates body fat based on height and weight. Your health care provider can help determine your BMI and help you achieve or maintain a healthy weight. Get regular exercise Get regular exercise. This is one of the most important things you can do for your health. Most adults should:  Exercise for at least 150 minutes each week. The exercise should increase your heart rate and make you sweat (moderate-intensity exercise).  Do strengthening exercises at least twice a week. This is in addition to the moderate-intensity exercise.  Spend less time sitting. Even light physical activity can be beneficial. Watch cholesterol and blood lipids Have your blood tested for lipids and cholesterol at 33 years of age, then have this test every 5 years. You may need to have your cholesterol levels checked more often if:  Your lipid or cholesterol levels are high.  You are older than 33 years of age.  You are at high risk for heart disease. What should I know about cancer screening? Many types of cancers can be detected early and may often be prevented. Depending on your health history and family history, you may need to have cancer screening at various ages. This may include screening for:  Colorectal cancer.  Prostate cancer.  Skin cancer.  Lung cancer. What should I know about heart disease, diabetes, and high blood pressure? Blood pressure and heart disease    High blood pressure causes heart disease and increases the risk of stroke. This is more likely to develop in people who have high blood pressure readings, are of African descent, or are overweight.  Talk with your health care provider about your target blood pressure readings.  Have your blood pressure checked: ? Every 3-5 years if you are 18-39 years of age. ? Every year if you are 40 years old or older.  If you  are between the ages of 65 and 75 and are a current or former smoker, ask your health care provider if you should have a one-time screening for abdominal aortic aneurysm (AAA). Diabetes Have regular diabetes screenings. This checks your fasting blood sugar level. Have the screening done:  Once every three years after age 45 if you are at a normal weight and have a low risk for diabetes.  More often and at a younger age if you are overweight or have a high risk for diabetes. What should I know about preventing infection? Hepatitis B If you have a higher risk for hepatitis B, you should be screened for this virus. Talk with your health care provider to find out if you are at risk for hepatitis B infection. Hepatitis C Blood testing is recommended for:  Everyone born from 1945 through 1965.  Anyone with known risk factors for hepatitis C. Sexually transmitted infections (STIs)  You should be screened each year for STIs, including gonorrhea and chlamydia, if: ? You are sexually active and are younger than 33 years of age. ? You are older than 33 years of age and your health care provider tells you that you are at risk for this type of infection. ? Your sexual activity has changed since you were last screened, and you are at increased risk for chlamydia or gonorrhea. Ask your health care provider if you are at risk.  Ask your health care provider about whether you are at high risk for HIV. Your health care provider may recommend a prescription medicine to help prevent HIV infection. If you choose to take medicine to prevent HIV, you should first get tested for HIV. You should then be tested every 3 months for as long as you are taking the medicine. Follow these instructions at home: Lifestyle  Do not use any products that contain nicotine or tobacco, such as cigarettes, e-cigarettes, and chewing tobacco. If you need help quitting, ask your health care provider.  Do not use street drugs.  Do  not share needles.  Ask your health care provider for help if you need support or information about quitting drugs. Alcohol use  Do not drink alcohol if your health care provider tells you not to drink.  If you drink alcohol: ? Limit how much you have to 0-2 drinks a day. ? Be aware of how much alcohol is in your drink. In the [place] one drink equals one 12 oz bottle of beer (355 mL), one 5 oz glass of wine (148 mL), or one 1 oz glass of hard liquor (44 mL). General instructions  Schedule regular health, dental, and eye exams.  Stay current with your vaccines.  Tell your health care provider if: ? You often feel depressed. ? You have ever been abused or do not feel safe at home. Summary  Adopting a healthy lifestyle and getting preventive care are important in promoting health and wellness.  Follow your health care provider's instructions about healthy diet, exercising, and getting tested or screened for diseases.  Follow   your health care provider's instructions on monitoring your cholesterol and blood pressure. This information is not intended to replace advice given to you by your health care provider. Make sure you discuss any questions you have with your health care provider. Document Released: 06/13/2008 Document Revised: 12/09/2018 Document Reviewed: 12/09/2018 Elsevier Patient Education  2020 Elsevier Inc.  

## 2019-07-21 ENCOUNTER — Encounter: Payer: Self-pay | Admitting: Adult Health

## 2019-07-21 LAB — CBC WITH DIFFERENTIAL/PLATELET
Basophils Absolute: 0.1 10*3/uL (ref 0.0–0.2)
Basos: 1 %
EOS (ABSOLUTE): 0.1 10*3/uL (ref 0.0–0.4)
Eos: 2 %
Hematocrit: 44.7 % (ref 37.5–51.0)
Hemoglobin: 16.1 g/dL (ref 13.0–17.7)
Immature Grans (Abs): 0 10*3/uL (ref 0.0–0.1)
Immature Granulocytes: 0 %
Lymphocytes Absolute: 1.7 10*3/uL (ref 0.7–3.1)
Lymphs: 32 %
MCH: 32.5 pg (ref 26.6–33.0)
MCHC: 36 g/dL — ABNORMAL HIGH (ref 31.5–35.7)
MCV: 90 fL (ref 79–97)
Monocytes Absolute: 0.5 10*3/uL (ref 0.1–0.9)
Monocytes: 10 %
Neutrophils Absolute: 2.9 10*3/uL (ref 1.4–7.0)
Neutrophils: 55 %
Platelets: 255 10*3/uL (ref 150–450)
RBC: 4.96 x10E6/uL (ref 4.14–5.80)
RDW: 13 % (ref 11.6–15.4)
WBC: 5.4 10*3/uL (ref 3.4–10.8)

## 2019-07-21 LAB — COMPREHENSIVE METABOLIC PANEL
ALT: 39 IU/L (ref 0–44)
AST: 27 IU/L (ref 0–40)
Albumin/Globulin Ratio: 2 (ref 1.2–2.2)
Albumin: 4.9 g/dL (ref 4.0–5.0)
Alkaline Phosphatase: 51 IU/L (ref 39–117)
BUN/Creatinine Ratio: 14 (ref 9–20)
BUN: 17 mg/dL (ref 6–20)
Bilirubin Total: 0.6 mg/dL (ref 0.0–1.2)
CO2: 22 mmol/L (ref 20–29)
Calcium: 9.6 mg/dL (ref 8.7–10.2)
Chloride: 102 mmol/L (ref 96–106)
Creatinine, Ser: 1.21 mg/dL (ref 0.76–1.27)
GFR calc Af Amer: 90 mL/min/{1.73_m2} (ref >59)
GFR calc non Af Amer: 78 mL/min/{1.73_m2} (ref >59)
Globulin, Total: 2.4 g/dL (ref 1.5–4.5)
Glucose: 94 mg/dL (ref 65–99)
Potassium: 4.3 mmol/L (ref 3.5–5.2)
Sodium: 141 mmol/L (ref 134–144)
Total Protein: 7.3 g/dL (ref 6.0–8.5)

## 2019-07-21 LAB — LIPID PANEL WITH LDL/HDL RATIO
Cholesterol, Total: 291 mg/dL — ABNORMAL HIGH (ref 100–199)
HDL: 57 mg/dL (ref >39)
LDL Calculated: 207 mg/dL — ABNORMAL HIGH (ref 0–99)
LDl/HDL Ratio: 3.6 ratio (ref 0.0–3.6)
Triglycerides: 134 mg/dL (ref 0–149)
VLDL Cholesterol Cal: 27 mg/dL (ref 5–40)

## 2019-07-21 LAB — ABO/RH: Rh Factor: POSITIVE

## 2019-07-21 NOTE — Progress Notes (Signed)
My Chart message sent

## 2019-07-22 ENCOUNTER — Ambulatory Visit: Payer: Managed Care, Other (non HMO) | Admitting: Adult Health

## 2019-07-22 ENCOUNTER — Other Ambulatory Visit: Payer: Self-pay

## 2019-07-22 ENCOUNTER — Encounter: Payer: Self-pay | Admitting: Adult Health

## 2019-07-22 VITALS — BP 118/78 | HR 96 | Temp 98.5°F | Resp 18

## 2019-07-22 DIAGNOSIS — R319 Hematuria, unspecified: Secondary | ICD-10-CM

## 2019-07-22 DIAGNOSIS — A599 Trichomoniasis, unspecified: Secondary | ICD-10-CM | POA: Diagnosis not present

## 2019-07-22 DIAGNOSIS — N390 Urinary tract infection, site not specified: Secondary | ICD-10-CM | POA: Diagnosis not present

## 2019-07-22 DIAGNOSIS — R399 Unspecified symptoms and signs involving the genitourinary system: Secondary | ICD-10-CM | POA: Insufficient documentation

## 2019-07-22 LAB — POCT URINALYSIS DIPSTICK
Bilirubin, UA: NEGATIVE
Glucose, UA: NEGATIVE
Ketones, UA: NEGATIVE
Nitrite, UA: NEGATIVE
Protein, UA: POSITIVE — AB
Spec Grav, UA: 1.01 (ref 1.010–1.025)
Urobilinogen, UA: 0.2 E.U./dL
pH, UA: 6.5 (ref 5.0–8.0)

## 2019-07-22 MED ORDER — CIPROFLOXACIN HCL 500 MG PO TABS: 500.0000 mg | ORAL_TABLET | Freq: Two times a day (BID) | ORAL | 0 refills | Status: AC

## 2019-07-22 NOTE — Progress Notes (Addendum)
[facility]   Patient ID: Francis Allen DOB: 33 y.o. MRN: [id number]   Subjective: Patient is a 33 year old male in no acute distress who comes to the clinic with complain of pain with urination that started this morning 07/22/19. He was seen on 07/20/19 for a follow up test for trichomonas cure and was also seen for his biometric. He was asymptomatic at that time.  He reports he has been doing increased running like he does daily around 2 miles  and has been using powder in his underwear while he runs to keep dry.  He did run yesterday without any problems.  He denies injury or trauma.   He reports this morning he did not go for a run as he had burning painful with urination upon awakening and noticed a " few strings of blood" that he passed with urine that were pink in color and stringy patient had picture on his phone to show provider. No gross hematuria. Urine color was clear in toilet picture patient provided. He reports that he did increase his water intake this morning and after doing so he has noticed no more blood in his urine. Denies any mucous noted.   He denies penile pain,penile or rectal discharge. He denies abdominal pain. He denies rectal pain or pressure.  He reports his urine stream is normal and no issues with urination. He denies hesitancy. He does report frequency. He has lower back pain rates 2/10 on pain scale at worst.  He denies ever having a urinary tract infection, bladder disease , urethritis, prostatitis, testicular issues ever in the past. No previous surgery.   He denies any rash, testicular swelling, redness or pain. 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   His wife retested for Trichomonas and was cleared as negative post treatment. His wife has had recurrent testing for Trich and continued to be positive last year and one incidence this year.  He was treated for  trichomonas on 06/24/19 with full course of Flagyl and was asymptomatic.   He declines any other Sexually transmitted disease testing at today's visit other than repeat urine for Gonorrhea/ Chlamydia/ and trichomonas. Provider made patient aware of the [facility] recommendation.  He denies any new sexual partners, denies rectal intercourse.   Patient  denies any fever, body aches,chills, rash, chest pain, shortness of breath, nausea, vomiting, or diarrhea.  No malaise or  body aches.   Objective: Blood pressure 118/78, pulse 96, temperature 98.5 F (36.9 C), temperature source Oral, resp. rate 18, SpO2 98 %.  Physical Exam  Vitals reviewed. Constitutional: He is oriented to person, place, and time. He appears well-developed and well-nourished.  HENT:  Head: Normocephalic and atraumatic.  Eyes: Pupils are equal, round, and reactive to light. Conjunctivae and EOM are normal.  Neck: Normal range of motion. Neck supple.  Cardiovascular: Normal rate and regular rhythm. Exam reveals no gallop and no friction rub.  No murmur heard. Respiratory: Effort normal and breath sounds normal. No respiratory distress. He has no wheezes. He has no rales. He exhibits no tenderness.  GI: Soft. Bowel sounds are normal. He exhibits no distension and no mass. There is no abdominal tenderness. There is no rebound and no guarding.  Genitourinary:    Genitourinary Comments: Patient declined recomended rectal/ testicular/ or penile exam with provider and chaperon. Deferred    Musculoskeletal: Normal range of motion.  Neurological: He is alert and oriented to person, place, and time.  Skin: Skin is warm and dry. No rash noted. No erythema. No pallor.  Psychiatric: He has a normal mood and affect. His behavior is normal. Judgment and thought content normal.       Assessment:    ICD-10-CM   1. Urinary tract infection symptoms  R39.9 POCT urinalysis dipstick    Urinalysis, Routine w reflex  microscopic    Urine Culture    Chlamydia/Gonococcus/Trichomonas, NAA  2. Trichomonal infection- history of 6/25 treated- pending test of cure   A59.9 Chlamydia/Gonococcus/Trichomonas, NAA    Orders Placed This Encounter  Procedures  . Urine Culture  . Chlamydia/Gonococcus/Trichomonas, NAA  . Urinalysis, Routine w reflex microscopic  . POCT urinalysis dipstick     Plan: Will treat with Cipro as below for urinary tract infection given Urinalysis results . He is advised of red flags for pyelonephritis, kidney stones, urethritis, testicular diagnoses and other possible differentials.  He will proceed to the nearest emergency room if symptom worsen at anytime or RED flags develop.   He is advised if hematuria or discolored urine remains after antibiotics complete he will need immediate referral to urology.  He declines KUB x ray to rule out large kidney stone.   Gonorrhea/ Chlamydia/ Trich urine still pending from 07/20/19 will repeat today given Urinalysis results and he is symptomatic.   Treatment options discussed, as well as risks, benefits, alternatives. Patient voiced understanding and agreement with the following plans:  New Prescriptions   CIPROFLOXACIN (CIPRO) 500 MG TABLET    Take 1 tablet (500 mg total) by mouth 2 (two) times daily.     Follow up appointment recommended after completing antibiotics to be sure hematuria has cleared from dipstick urine. Patient will call to schedule.  Will call with urine results when they return.   He is advised he needs primary care provider and and  resource list to find a provider of his choice.  Follow-up with your primary care doctor in 3 days if not improving, or sooner if symptoms become worse. Precautions discussed. Red flags discussed. Questions invited and answered. Patient voiced understanding and agreement.    Advised patient call the office or your primary care doctor for an appointment if no improvement within 72 hours or  if any symptoms change or worsen at any time  Advised ER or urgent Care if after hours or on weekend. Call 911 for emergency symptoms at any time.Patinet verbalized understanding of all instructions given/reviewed and treatment plan and has no further questions or concerns at this time.

## 2019-07-22 NOTE — Patient Instructions (Signed)
Urinary Tract Infection, Adult A urinary tract infection (UTI) is an infection of any part of the urinary tract. The urinary tract includes:  The kidneys.  The ureters.  The bladder.  The urethra. These organs make, store, and get rid of pee (urine) in the body. What are the causes? This is caused by germs (bacteria) in your genital area. These germs grow and cause swelling (inflammation) of your urinary tract. What increases the risk? You are more likely to develop this condition if:  You have a small, thin tube (catheter) to drain pee.  You cannot control when you pee or poop (incontinence).  You are male, and: ? You use these methods to prevent pregnancy: ? A medicine that kills sperm (spermicide). ? A device that blocks sperm (diaphragm). ? You have low levels of a male hormone (estrogen). ? You are pregnant.  You have genes that add to your risk.  You are sexually active.  You take antibiotic medicines.  You have trouble peeing because of: ? A prostate that is bigger than normal, if you are male. ? A blockage in the part of your body that drains pee from the bladder (urethra). ? A kidney stone. ? A nerve condition that affects your bladder (neurogenic bladder). ? Not getting enough to drink. ? Not peeing often enough.  You have other conditions, such as: ? Diabetes. ? A weak disease-fighting system (immune system). ? Sickle cell disease. ? Gout. ? Injury of the spine. What are the signs or symptoms? Symptoms of this condition include:  Needing to pee right away (urgently).  Peeing often.  Peeing small amounts often.  Pain or burning when peeing.  Blood in the pee.  Pee that smells bad or not like normal.  Trouble peeing.  Pee that is cloudy.  Fluid coming from the vagina, if you are male.  Pain in the belly or lower back. Other symptoms include:  Throwing up (vomiting).  No urge to eat.  Feeling mixed up (confused).  Being tired  and grouchy (irritable).  A fever.  Watery poop (diarrhea). How is this treated? This condition may be treated with:  Antibiotic medicine.  Other medicines.  Drinking enough water. Follow these instructions at home:  Medicines  Take over-the-counter and prescription medicines only as told by your doctor.  If you were prescribed an antibiotic medicine, take it as told by your doctor. Do not stop taking it even if you start to feel better. General instructions  Make sure you: ? Pee until your bladder is empty. ? Do not hold pee for a long time. ? Empty your bladder after sex. ? Wipe from front to back after pooping if you are a male. Use each tissue one time when you wipe.  Drink enough fluid to keep your pee pale yellow.  Keep all follow-up visits as told by your doctor. This is important. Contact a doctor if:  You do not get better after 1-2 days.  Your symptoms go away and then come back. Get help right away if:  You have very bad back pain.  You have very bad pain in your lower belly.  You have a fever.  You are sick to your stomach (nauseous).  You are throwing up. Summary  A urinary tract infection (UTI) is an infection of any part of the urinary tract.  This condition is caused by germs in your genital area.  There are many risk factors for a UTI. These include having a small, thin   tube to drain pee and not being able to control when you pee or poop.  Treatment includes antibiotic medicines for germs.  Drink enough fluid to keep your pee pale yellow. This information is not intended to replace advice given to you by your health care provider. Make sure you discuss any questions you have with your health care provider. Document Released: 06/03/2008 Document Revised: 12/03/2018 Document Reviewed: 06/25/2018 Elsevier Patient Education  [place].  Ciprofloxacin tablets What is this medicine? CIPROFLOXACIN (sip roe FLOX a sin) is a quinolone  antibiotic. It is used to treat certain kinds of bacterial infections. It will not work for colds, flu, or other viral infections. This medicine may be used for other purposes; ask your health care provider or pharmacist if you have questions. COMMON BRAND NAME(S): Cipro What should I tell my health care provider before I take this medicine? They need to know if you have any of these conditions:  bone problems  diabetes  heart disease  high blood pressure  history of irregular heartbeat  history of low levels of potassium in the blood  joint problems  kidney disease  liver disease  mental illness  myasthenia gravis  seizures  tendon problems  tingling of the fingers or toes, or other nerve disorder  an unusual or allergic reaction to ciprofloxacin, other antibiotics or medicines, foods, dyes, or preservatives  pregnant or trying to get pregnant  breast-feeding How should I use this medicine? Take this medicine by mouth with a full glass of water. Follow the directions on the prescription label. You can take it with or without food. If it upsets your stomach, take it with food. Take your medicine at regular intervals. Do not take your medicine more often than directed. Take all of your medicine as directed even if you think you are better. Do not skip doses or stop your medicine early. Avoid antacids, aluminum, calcium, iron, magnesium, and zinc products for 6 hours before and 2 hours after taking a dose of this medicine. A special MedGuide will be given to you by the pharmacist with each prescription and refill. Be sure to read this information carefully each time. Talk to your pediatrician regarding the use of this medicine in children. Special care may be needed. Overdosage: If you think you have taken too much of this medicine contact a poison control center or emergency room at once. NOTE: This medicine is only for you. Do not share this medicine with others. What if  I miss a dose? If you miss a dose, take it as soon as you can. If it is almost time for your next dose, take only that dose. Do not take double or extra doses. What may interact with this medicine? Do not take this medicine with any of the following medications:  cisapride  dronedarone  flibanserin  lomitapide  pimozide  thioridazine  tizanidine This medicine may also interact with the following medications:  antacids  birth control pills  caffeine  certain medicines for diabetes, like glipizide, glyburide, or insulin  certain medicines that treat or prevent blood clots like warfarin  clozapine  cyclosporine  didanosine buffered tablets or powder  dofetilide  duloxetine  lanthanum carbonate  lidocaine  methotrexate  multivitamins  NSAIDS, medicines for pain and inflammation, like ibuprofen or naproxen  olanzapine  omeprazole  other medicines that prolong the QT interval (cause an abnormal heart rhythm)  phenytoin  probenecid  ropinirole  sevelamer  sildenafil  sucralfate  theophylline  ziprasidone  zolpidem This list may not describe all possible interactions. Give your health care provider a list of all the medicines, herbs, non-prescription drugs, or dietary supplements you use. Also tell them if you smoke, drink alcohol, or use illegal drugs. Some items may interact with your medicine. What should I watch for while using this medicine? Tell your doctor or health care provider if your symptoms do not start to get better or if they get worse. This medicine may cause serious skin reactions. They can happen weeks to months after starting the medicine. Contact your health care provider right away if you notice fevers or flu-like symptoms with a rash. The rash may be red or purple and then turn into blisters or peeling of the skin. Or, you might notice a red rash with swelling of the face, lips or lymph nodes in your neck or under your arms.  Do not treat diarrhea with over the counter products. Contact your doctor if you have diarrhea that lasts more than 2 days or if it is severe and watery. Check with your doctor or health care provider if you get an attack of severe diarrhea, nausea and vomiting, or if you sweat a lot. The loss of too much body fluid can make it dangerous for you to take this medicine. This medicine may increase blood sugar. Ask your health care provider if changes in diet or medicines are needed if you have diabetes. You may get drowsy or dizzy. Do not drive, use machinery, or do anything that needs mental alertness until you know how this medicine affects you. Do not sit or stand up quickly, especially if you are an older patient. This reduces the risk of dizzy or fainting spells. This medicine can make you more sensitive to the sun. Keep out of the sun. If you cannot avoid being in the sun, wear protective clothing and use sunscreen. Do not use sun lamps or tanning beds/booths. What side effects may I notice from receiving this medicine? Side effects that you should report to your doctor or health care professional as soon as possible:  allergic reactions like skin rash or hives, swelling of the face, lips, or tongue  anxious  bloody or watery diarrhea  confusion  depressed mood  fast, irregular heartbeat  fever  hallucination, loss of contact with reality  joint, muscle, or tendon pain or swelling  loss of memory  pain, tingling, numbness in the hands or feet  redness, blistering, peeling or loosening of the skin, including inside the mouth  seizures  signs and symptoms of aortic dissection such as sudden chest, stomach, or back pain  signs and symptoms of high blood sugar such as being more thirsty or hungry or having to urinate more than normal. You may also feel very tired or have blurry vision.  signs and symptoms of liver injury like dark yellow or brown urine; general ill feeling or  flu-like symptoms; light-colored stools; loss of appetite; nausea; right upper belly pain; unusually weak or tired; yellowing of the eyes or skin  signs and symptoms of low blood sugar such as feeling anxious; confusion; dizziness; increased hunger; unusually weak or tired; sweating; shakiness; cold; irritable; headache; blurred vision; fast heartbeat; loss of consciousness; pale skin  suicidal thoughts or other mood changes  sunburn  unusually weak or tired Side effects that usually do not require medical attention (report to your doctor or health care professional if they continue or are bothersome):  dry mouth  headache  nausea  trouble sleeping This list may not describe all possible side effects. Call your doctor for medical advice about side effects. You may report side effects to FDA at 1-800-FDA-1088. Where should I keep my medicine? Keep out of the reach of children. Store at room temperature below 30 degrees C (86 degrees F). Keep container tightly closed. Throw away any unused medicine after the expiration date. NOTE: This sheet is a summary. It may not cover all possible information. If you have questions about this medicine, talk to your doctor, pharmacist, or health care provider.  2020 Elsevier/Gold Standard (2019-03-18 11:26:08)   Hematuria, Adult Hematuria is blood in the urine. Blood may be visible in the urine, or it may be identified with a test. This condition can be caused by infections of the bladder, urethra, kidney, or prostate. Other possible causes include:  Kidney stones.  Cancer of the urinary tract.  Too much calcium in the urine.  Conditions that are passed from parent to child (inherited conditions).  Exercise that requires a lot of energy. Infections can usually be treated with medicine, and a kidney stone usually will pass through your urine. If neither of these is the cause of your hematuria, more tests may be needed to identify the cause of  your symptoms. It is very important to tell your health care provider about any blood in your urine, even if it is painless or the blood stops without treatment. Blood in the urine, when it happens and then stops and then happens again, can be a symptom of a very serious condition, including cancer. There is no pain in the initial stages of many urinary cancers. Follow these instructions at home: Medicines  Take over-the-counter and prescription medicines only as told by your health care provider.  If you were prescribed an antibiotic medicine, take it as told by your health care provider. Do not stop taking the antibiotic even if you start to feel better. Eating and drinking  Drink enough fluid to keep your urine clear or pale yellow. It is recommended that you drink 3-4 quarts (2.8-3.8 L) a day. If you have been diagnosed with an infection, it is recommended that you drink cranberry juice in addition to large amounts of water.  Avoid caffeine, tea, and carbonated beverages. These tend to irritate the bladder.  Avoid alcohol because it may irritate the prostate (men). General instructions  If you have been diagnosed with a kidney stone, follow your health care provider's instructions about straining your urine to catch the stone.  Empty your bladder often. Avoid holding urine for long periods of time.  If you are male: ? After a bowel movement, wipe from front to back and use each piece of toilet paper only once. ? Empty your bladder before and after sex.  Pay attention to any changes in your symptoms. Tell your health care provider about any changes or any new symptoms.  It is your responsibility to get your test results. Ask your health care provider, or the department performing the test, when your results will be ready.  Keep all follow-up visits as told by your health care provider. This is important. Contact a health care provider if:  You develop back pain.  You have a  fever.  You have nausea or vomiting.  Your symptoms do not improve after 3 days.  Your symptoms get worse. Get help right away if:  You develop severe vomiting and are unable take medicine without vomiting.  You develop severe  pain in your back or abdomen even though you are taking medicine.  You pass a large amount of blood in your urine.  You pass blood clots in your urine.  You feel very weak or like you might faint.  You faint. Summary  Hematuria is blood in the urine. It has many possible causes.  It is very important that you tell your health care provider about any blood in your urine, even if it is painless or the blood stops without treatment.  Take over-the-counter and prescription medicines only as told by your health care provider.  Drink enough fluid to keep your urine clear or pale yellow. This information is not intended to replace advice given to you by your health care provider. Make sure you discuss any questions you have with your health care provider. Document Released: 12/16/2005 Document Revised: 11/28/2017 Document Reviewed: 01/18/2017 Elsevier Patient Education  2020 [employer]

## 2019-07-23 ENCOUNTER — Encounter: Payer: Self-pay | Admitting: Adult Health

## 2019-07-23 LAB — URINALYSIS, ROUTINE W REFLEX MICROSCOPIC
Bilirubin, UA: NEGATIVE
Glucose, UA: NEGATIVE
Ketones, UA: NEGATIVE
Nitrite, UA: NEGATIVE
Specific Gravity, UA: 1.005 (ref 1.005–1.030)
Urobilinogen, Ur: 0.2 mg/dL (ref 0.2–1.0)
pH, UA: 6 (ref 5.0–7.5)

## 2019-07-23 LAB — MICROSCOPIC EXAMINATION
Casts: NONE SEEN /lpf
Epithelial Cells (non renal): NONE SEEN /hpf (ref 0–10)
WBC, UA: >30 /hpf — AB (ref 0–5)

## 2019-07-24 LAB — CHLAMYDIA/GONOCOCCUS/TRICHOMONAS, NAA
Chlamydia by NAA: NEGATIVE
Gonococcus by NAA: NEGATIVE
Trich vag by NAA: NEGATIVE

## 2019-07-26 ENCOUNTER — Encounter: Payer: Self-pay | Admitting: Adult Health

## 2019-07-26 ENCOUNTER — Telehealth: Payer: Self-pay | Admitting: Adult Health

## 2019-07-26 LAB — URINE CULTURE

## 2019-07-26 NOTE — Progress Notes (Signed)
Mychart message sent.

## 2019-07-26 NOTE — Telephone Encounter (Addendum)
  Called patient with results and released to Mychart, Trichomonas returned negative after treatment result as we discussed your urine culture is pending. Continue your antibiotics as prescribed and be sure to take all of them. He reports he is feeling much better symptoms resolved from last office visit with Cipro. Urine culture pending.  I will call or My chart message you with urine culture results once I receive them. Follow up if any symptoms change or worsen. Emergency care as needed after hours.

## 2019-07-27 ENCOUNTER — Encounter: Payer: Self-pay | Admitting: Adult Health

## 2019-07-27 LAB — CHLAMYDIA/GONOCOCCUS/TRICHOMONAS, NAA
Chlamydia by NAA: NEGATIVE
Gonococcus by NAA: NEGATIVE
Trich vag by NAA: NEGATIVE
